# Patient Record
Sex: Male | Born: 1970 | Race: White | Hispanic: No | Marital: Single
Health system: Southern US, Community
[De-identification: ages and names within clinical notes are randomized; demographics above are authoritative.]

---

## 2008-09-12 ENCOUNTER — Emergency Department (HOSPITAL_COMMUNITY): Admission: EM | Admit: 2008-09-12 | Discharge: 2008-09-12 | Payer: Self-pay | Admitting: Family Medicine

## 2009-11-16 ENCOUNTER — Emergency Department (HOSPITAL_COMMUNITY): Admission: EM | Admit: 2009-11-16 | Discharge: 2009-11-16 | Payer: Self-pay | Admitting: Family Medicine

## 2011-03-30 IMAGING — CR DG ANKLE COMPLETE 3+V*L*
3 series · 3 of 3 positions shown · non-contrast
Comparison: None.

CLINICAL DATA: Fall.  Ankle injury and pain.

LEFT ANKLE COMPLETE - 3+ VIEW

[view not recorded (1 of 3)]
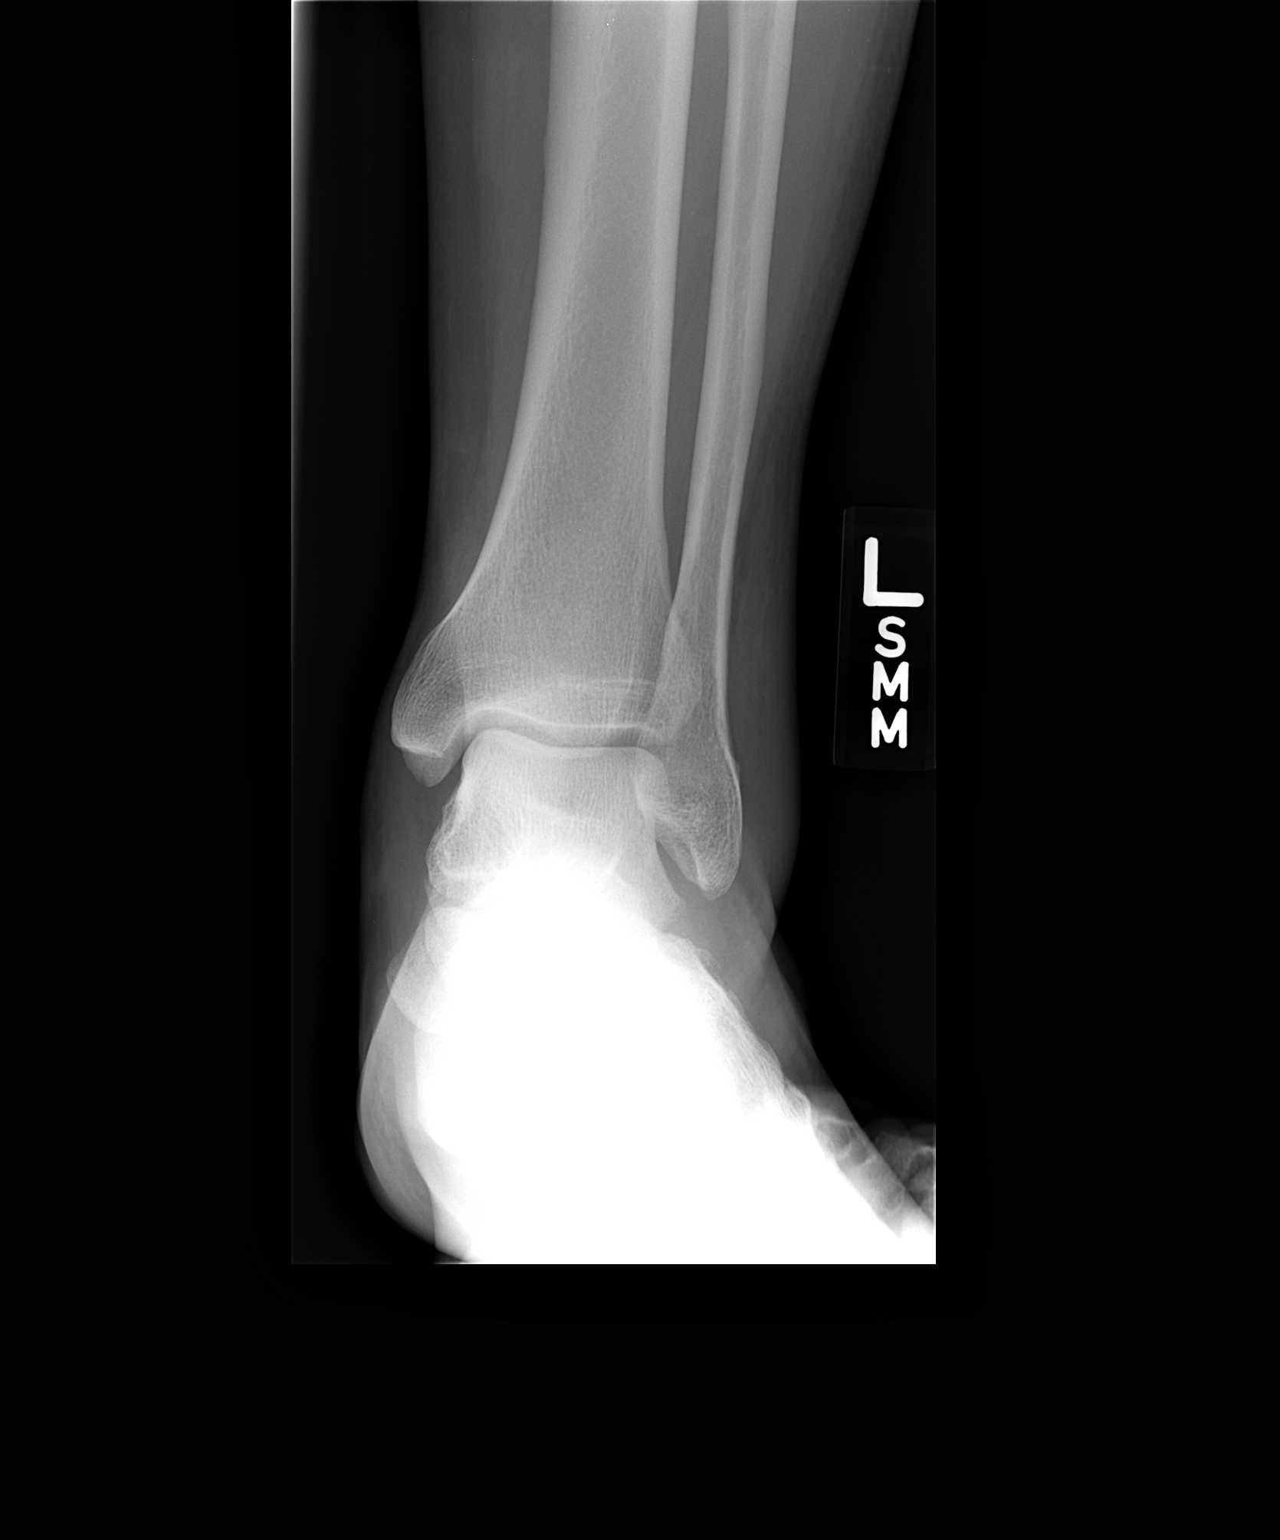

[view not recorded (2 of 3)]
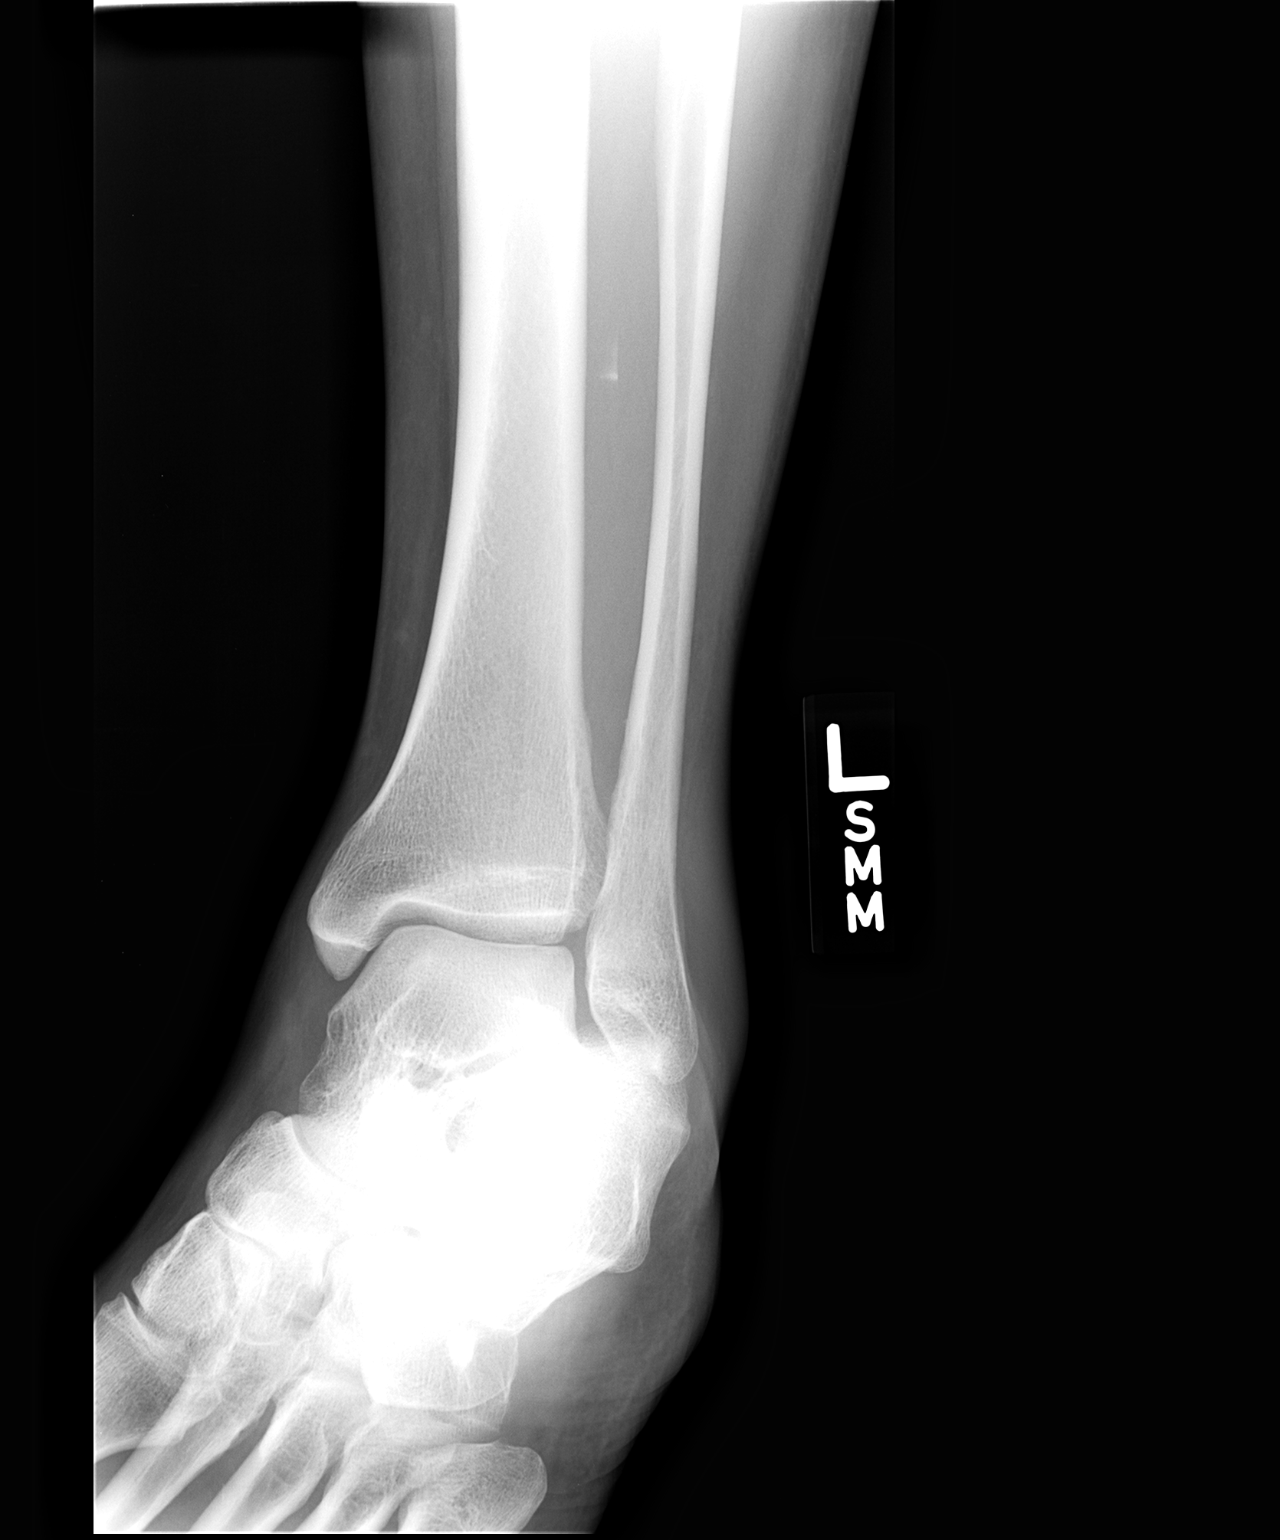

[view not recorded (3 of 3)]
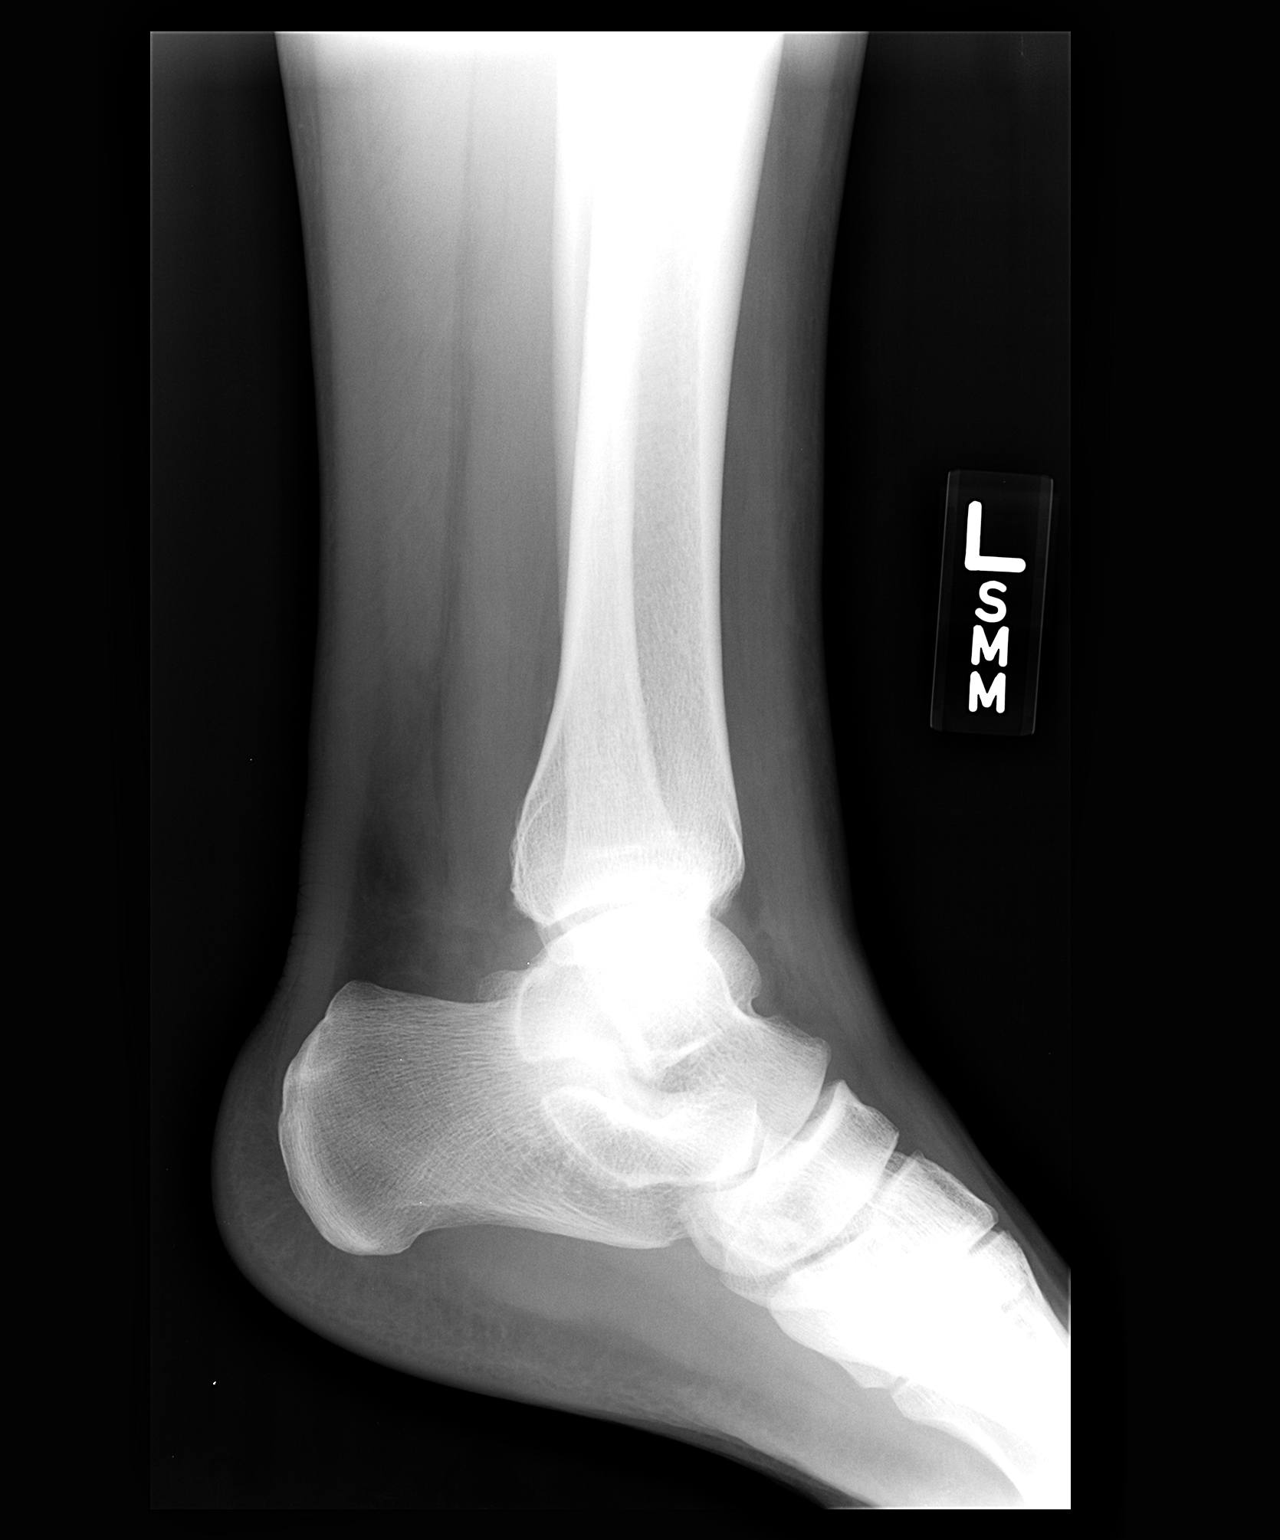

[3 of 3 positions shown; findings below may reference images not displayed]

FINDINGS: Soft tissue swelling is seen overlying the lateral
malleolus.  There is no evidence of fracture or dislocation.  No
other significant bone abnormality identified.
IMPRESSION: Lateral soft tissue swelling.  No evidence of fracture .

## 2019-04-03 ENCOUNTER — Ambulatory Visit: Payer: Self-pay | Attending: Internal Medicine

## 2019-04-03 DIAGNOSIS — Z23 Encounter for immunization: Secondary | ICD-10-CM

## 2019-04-03 NOTE — Progress Notes (Signed)
   Covid-19 Vaccination Clinic  Name:  Tyrone Lowe    MRN: [id number] DOB: 06/01/1970  04/03/2019  Mr. Tyrone Lowe was observed post Covid-19 immunization for 15 minutes without incident. He was provided with Vaccine Information Sheet and instruction to access the V-Safe system.   Mr. Tyrone Lowe was instructed to call 911 with any severe reactions post vaccine: Marland Kitchen Difficulty breathing  . Swelling of face and throat  . A fast heartbeat  . A bad rash all over body  . Dizziness and weakness   Immunizations Administered    Name Date Dose VIS Date Route   Moderna COVID-19 Vaccine 04/03/2019 12:12 PM 0.5 mL 12/24/2018 Intramuscular   Manufacturer: Moderna   Lot: [id number]   NDC: 68032-122-48

## 2019-05-07 ENCOUNTER — Ambulatory Visit: Payer: Self-pay | Attending: Internal Medicine

## 2019-05-07 DIAGNOSIS — Z23 Encounter for immunization: Secondary | ICD-10-CM

## 2019-05-07 NOTE — Progress Notes (Signed)
   Covid-19 Vaccination Clinic  Name:  Royce Stegman    MRN: [id number] DOB: 11-May-1970  05/07/2019  Mr. Simien was observed post Covid-19 immunization for 15 minutes without incident. He was provided with Vaccine Information Sheet and instruction to access the V-Safe system.   Mr. Seago was instructed to call 911 with any severe reactions post vaccine: Marland Kitchen Difficulty breathing  . Swelling of face and throat  . A fast heartbeat  . A bad rash all over body  . Dizziness and weakness   Immunizations Administered    Name Date Dose VIS Date Route   Moderna COVID-19 Vaccine 05/07/2019  3:42 PM 0.5 mL 12/24/2018 Intramuscular   Manufacturer: Moderna   Lot: [id number]   NDC: 08719-941-29

## 2021-08-22 ENCOUNTER — Encounter: Payer: Self-pay | Admitting: Podiatry

## 2021-08-22 ENCOUNTER — Ambulatory Visit: Payer: Managed Care, Other (non HMO) | Admitting: Podiatry

## 2021-08-22 DIAGNOSIS — B351 Tinea unguium: Secondary | ICD-10-CM

## 2021-08-22 DIAGNOSIS — M79674 Pain in right toe(s): Secondary | ICD-10-CM | POA: Diagnosis not present

## 2021-08-22 DIAGNOSIS — M79675 Pain in left toe(s): Secondary | ICD-10-CM | POA: Diagnosis not present

## 2021-08-22 NOTE — Progress Notes (Signed)
  Subjective:  Patient ID: Tyrone Lowe, male    DOB: 11-Apr-1970,  MRN: [id number]  Chief Complaint  Patient presents with   Foot Problem    bil thick nails need to be trimmed    51 y.o. male presents with the above complaint. History confirmed with patient.  Nails are thickened elongated discolored he is unable to cut them, they cause pain issues  Objective:  Physical Exam: warm, good capillary refill, no trophic changes or ulcerative lesions, normal DP and PT pulses, and normal sensory exam. Left Foot: dystrophic yellowed discolored nail plates with subungual debris Right Foot: dystrophic yellowed discolored nail plates with subungual debris  Assessment:   1. Pain due to onychomycosis of toenails of both feet      Plan:  Patient was evaluated and treated and all questions answered.  Discussed the etiology and treatment options for the condition in detail with the patient. Educated patient on the topical and oral treatment options for mycotic nails. Recommended debridement of the nails today. Sharp and mechanical debridement performed of all painful and mycotic nails today. Nails debrided in length and thickness using a nail nipper to level of comfort. Discussed treatment options including appropriate shoe gear. Follow up as needed for painful nails.    Return in about 3 months (around 11/22/2021) for painful thick nails.

## 2021-11-28 ENCOUNTER — Ambulatory Visit: Payer: Managed Care, Other (non HMO) | Admitting: Podiatry

## 2021-11-28 DIAGNOSIS — B351 Tinea unguium: Secondary | ICD-10-CM | POA: Diagnosis not present

## 2021-11-28 DIAGNOSIS — S90221A Contusion of right lesser toe(s) with damage to nail, initial encounter: Secondary | ICD-10-CM

## 2021-11-28 DIAGNOSIS — M79675 Pain in left toe(s): Secondary | ICD-10-CM

## 2021-11-28 DIAGNOSIS — M79674 Pain in right toe(s): Secondary | ICD-10-CM

## 2021-11-29 ENCOUNTER — Encounter: Payer: Self-pay | Admitting: Podiatry

## 2021-11-29 NOTE — Progress Notes (Signed)
This patient presents to the office with chief complaint of long thick painful nails.  Patient says the nails are painful walking and wearing shoes.  This patient is unable to self treat.  This patient is unable to trim his nails since she is unable to reach his nails.  he presents to the office for preventative foot care services.  He also admits to injuring his third toenail right foot this past Saturday.  The nail is intact  and there is blood formation under his nail.  He presents to the office for evaluation and treatment.  General Appearance  Alert, conversant and in no acute stress.  Vascular  Dorsalis pedis and posterior tibial  pulses are palpable  bilaterally.  Capillary return is within normal limits  bilaterally. Temperature is within normal limits  bilaterally.  Neurologic  Senn-Weinstein monofilament wire test within normal limits  bilaterally. Muscle power within normal limits bilaterally.  Nails Thick disfigured discolored nails with subungual debris  from hallux to fifth toes bilaterally. No evidence of bacterial infection or drainage bilaterally. Hematoma noted under third toenail right foot. No evidence of infection noted.  Orthopedic  No limitations of motion  feet .  No crepitus or effusions noted.  No bony pathology or digital deformities noted.  Skin  normotropic skin with no porokeratosis noted bilaterally.  No signs of infections or ulcers noted.     Onychomycosis  Nails  B/L.  Pain in right toes  Pain in left toes  Debridement of nails both feet followed trimming the nails with dremel tool.  Neosporin/DSD third toe right foot.  RTC 3 months.   Gardiner Barefoot DPM

## 2022-06-15 ENCOUNTER — Ambulatory Visit: Payer: Managed Care, Other (non HMO) | Admitting: Podiatry

## 2022-06-15 ENCOUNTER — Encounter: Payer: Self-pay | Admitting: Podiatry

## 2022-06-15 DIAGNOSIS — M79674 Pain in right toe(s): Secondary | ICD-10-CM

## 2022-06-15 DIAGNOSIS — M79675 Pain in left toe(s): Secondary | ICD-10-CM

## 2022-06-15 DIAGNOSIS — B351 Tinea unguium: Secondary | ICD-10-CM

## 2022-06-15 NOTE — Progress Notes (Signed)
This patient presents to the office with chief complaint of long thick painful nails.  Patient says the nails are painful walking and wearing shoes.  This patient is unable to self treat.  This patient is unable to trim his nails since she is unable to reach his nails.  he presents to the office for preventative foot care services. He presents to the office for evaluation and treatment.  General Appearance  Alert, conversant and in no acute stress.  Vascular  Dorsalis pedis and posterior tibial  pulses are palpable  bilaterally.  Capillary return is within normal limits  bilaterally. Temperature is within normal limits  bilaterally.  Neurologic  Senn-Weinstein monofilament wire test within normal limits  bilaterally. Muscle power within normal limits bilaterally.  Nails Thick disfigured discolored nails with subungual debris  from hallux to fifth toes bilaterally. No evidence of bacterial infection or drainage bilaterally. No evidence of infection noted.  Orthopedic  No limitations of motion  feet .  No crepitus or effusions noted.  No bony pathology or digital deformities noted.  Skin  normotropic skin with no porokeratosis noted bilaterally.  No signs of infections or ulcers noted.     Onychomycosis  Nails  B/L.  Pain in right toes  Pain in left toes  Debridement of nails both feet followed trimming the nails with dremel tool.    RTC 4  months.   Helane Gunther DPM

## 2022-10-16 ENCOUNTER — Ambulatory Visit: Payer: Managed Care, Other (non HMO) | Admitting: Podiatry

## 2023-04-30 ENCOUNTER — Encounter: Payer: Self-pay | Admitting: Podiatry

## 2023-04-30 ENCOUNTER — Ambulatory Visit: Admitting: Podiatry

## 2023-04-30 DIAGNOSIS — M79675 Pain in left toe(s): Secondary | ICD-10-CM

## 2023-04-30 DIAGNOSIS — B351 Tinea unguium: Secondary | ICD-10-CM | POA: Diagnosis not present

## 2023-04-30 DIAGNOSIS — M79674 Pain in right toe(s): Secondary | ICD-10-CM | POA: Insufficient documentation

## 2023-04-30 NOTE — Progress Notes (Signed)
 This patient presents to the office with chief complaint of long thick painful nails.  Patient says the nails are painful walking and wearing shoes.  This patient is unable to self treat.  This patient is unable to trim his nails since she is unable to reach his nails.  he presents to the office for preventative foot care services. He presents to the office for evaluation and treatment.  General Appearance  Alert, conversant and in no acute stress.  Vascular  Dorsalis pedis and posterior tibial  pulses are palpable  bilaterally.  Capillary return is within normal limits  bilaterally. Temperature is within normal limits  bilaterally.  Neurologic  Senn-Weinstein monofilament wire test within normal limits  bilaterally. Muscle power within normal limits bilaterally.  Nails Thick disfigured discolored nails with subungual debris  from hallux to fifth toes bilaterally. No evidence of bacterial infection or drainage bilaterally. No evidence of infection noted.  Orthopedic  No limitations of motion  feet .  No crepitus or effusions noted.  No bony pathology or digital deformities noted.  Skin  normotropic skin with no porokeratosis noted bilaterally.  No signs of infections or ulcers noted.     Onychomycosis  Nails  B/L.  Pain in right toes  Pain in left toes  Debridement of nails both feet followed trimming the nails with dremel tool.    RTC 3 months. Told him to make an appointment with Dr.  Allena Katz for possible medication therapy.   Helane Gunther DPM

## 2023-05-18 ENCOUNTER — Ambulatory Visit: Admitting: Podiatry

## 2023-07-30 ENCOUNTER — Ambulatory Visit: Admitting: Podiatry

## 2023-07-30 DIAGNOSIS — M79675 Pain in left toe(s): Secondary | ICD-10-CM

## 2023-07-30 DIAGNOSIS — B351 Tinea unguium: Secondary | ICD-10-CM

## 2023-07-30 DIAGNOSIS — M79674 Pain in right toe(s): Secondary | ICD-10-CM

## 2023-07-30 NOTE — Progress Notes (Signed)
This patient presents to the office with chief complaint of long thick painful nails.  Patient says the nails are painful walking and wearing shoes.  This patient is unable to self treat.  This patient is unable to trim his nails since she is unable to reach his nails.  he presents to the office for preventative foot care services. He presents to the office for evaluation and treatment.  General Appearance  Alert, conversant and in no acute stress.  Vascular  Dorsalis pedis and posterior tibial  pulses are palpable  bilaterally.  Capillary return is within normal limits  bilaterally. Temperature is within normal limits  bilaterally.  Neurologic  Senn-Weinstein monofilament wire test within normal limits  bilaterally. Muscle power within normal limits bilaterally.  Nails Thick disfigured discolored nails with subungual debris  from hallux to fifth toes bilaterally. No evidence of bacterial infection or drainage bilaterally. No evidence of infection noted.  Orthopedic  No limitations of motion  feet .  No crepitus or effusions noted.  No bony pathology or digital deformities noted.  Skin  normotropic skin with no porokeratosis noted bilaterally.  No signs of infections or ulcers noted.     Onychomycosis  Nails  B/L.  Pain in right toes  Pain in left toes  Debridement of nails both feet followed trimming the nails with dremel tool.    RTC 4  months.   Helane Gunther DPM

## 2023-12-03 ENCOUNTER — Ambulatory Visit: Admitting: Podiatry

## 2023-12-03 ENCOUNTER — Encounter: Payer: Self-pay | Admitting: Podiatry

## 2023-12-03 DIAGNOSIS — M79675 Pain in left toe(s): Secondary | ICD-10-CM

## 2023-12-03 DIAGNOSIS — B351 Tinea unguium: Secondary | ICD-10-CM

## 2023-12-03 DIAGNOSIS — M79674 Pain in right toe(s): Secondary | ICD-10-CM | POA: Diagnosis not present

## 2023-12-03 NOTE — Progress Notes (Signed)
This patient presents to the office with chief complaint of long thick painful nails.  Patient says the nails are painful walking and wearing shoes.  This patient is unable to self treat.  This patient is unable to trim his nails since she is unable to reach his nails.  he presents to the office for preventative foot care services. He presents to the office for evaluation and treatment.  General Appearance  Alert, conversant and in no acute stress.  Vascular  Dorsalis pedis and posterior tibial  pulses are palpable  bilaterally.  Capillary return is within normal limits  bilaterally. Temperature is within normal limits  bilaterally.  Neurologic  Senn-Weinstein monofilament wire test within normal limits  bilaterally. Muscle power within normal limits bilaterally.  Nails Thick disfigured discolored nails with subungual debris  from hallux to fifth toes bilaterally. No evidence of bacterial infection or drainage bilaterally. No evidence of infection noted.  Orthopedic  No limitations of motion  feet .  No crepitus or effusions noted.  No bony pathology or digital deformities noted.  Skin  normotropic skin with no porokeratosis noted bilaterally.  No signs of infections or ulcers noted.     Onychomycosis  Nails  B/L.  Pain in right toes  Pain in left toes  Debridement of nails both feet followed trimming the nails with dremel tool.    RTC 4  months.   Helane Gunther DPM

## 2024-04-01 ENCOUNTER — Ambulatory Visit: Admitting: Podiatry
